# Patient Record
Sex: Male | Born: 1965 | Race: White | Hispanic: No | Marital: Married | Smoking: Current every day smoker
Health system: Southern US, Community
[De-identification: ages and names within clinical notes are randomized; demographics above are authoritative.]

## PROBLEM LIST (undated history)

## (undated) DIAGNOSIS — E785 Hyperlipidemia, unspecified: Secondary | ICD-10-CM

## (undated) DIAGNOSIS — I10 Essential (primary) hypertension: Secondary | ICD-10-CM

## (undated) DIAGNOSIS — E119 Type 2 diabetes mellitus without complications: Secondary | ICD-10-CM

## (undated) HISTORY — PX: CARDIAC SURGERY: SHX584

## (undated) HISTORY — PX: TONSILLECTOMY: SUR1361

---

## 2016-04-17 ENCOUNTER — Emergency Department (HOSPITAL_COMMUNITY): Payer: Worker's Compensation

## 2016-04-17 ENCOUNTER — Encounter (HOSPITAL_COMMUNITY): Payer: Self-pay

## 2016-04-17 ENCOUNTER — Emergency Department (HOSPITAL_COMMUNITY)
Admission: EM | Admit: 2016-04-17 | Discharge: 2016-04-17 | Disposition: A | Discharge status: Home / Self Care | Payer: Worker's Compensation | Attending: Emergency Medicine | Admitting: Emergency Medicine

## 2016-04-17 DIAGNOSIS — I10 Essential (primary) hypertension: Secondary | ICD-10-CM | POA: Insufficient documentation

## 2016-04-17 DIAGNOSIS — Z880 Allergy status to penicillin: Secondary | ICD-10-CM | POA: Insufficient documentation

## 2016-04-17 DIAGNOSIS — Z791 Long term (current) use of non-steroidal anti-inflammatories (NSAID): Secondary | ICD-10-CM | POA: Insufficient documentation

## 2016-04-17 DIAGNOSIS — E785 Hyperlipidemia, unspecified: Secondary | ICD-10-CM | POA: Insufficient documentation

## 2016-04-17 DIAGNOSIS — Y998 Other external cause status: Secondary | ICD-10-CM | POA: Diagnosis not present

## 2016-04-17 DIAGNOSIS — F17210 Nicotine dependence, cigarettes, uncomplicated: Secondary | ICD-10-CM | POA: Insufficient documentation

## 2016-04-17 DIAGNOSIS — R5383 Other fatigue: Secondary | ICD-10-CM | POA: Diagnosis not present

## 2016-04-17 DIAGNOSIS — S81811A Laceration without foreign body, right lower leg, initial encounter: Secondary | ICD-10-CM | POA: Diagnosis not present

## 2016-04-17 DIAGNOSIS — Y9389 Activity, other specified: Secondary | ICD-10-CM | POA: Insufficient documentation

## 2016-04-17 DIAGNOSIS — E119 Type 2 diabetes mellitus without complications: Secondary | ICD-10-CM | POA: Insufficient documentation

## 2016-04-17 DIAGNOSIS — S0990XA Unspecified injury of head, initial encounter: Secondary | ICD-10-CM | POA: Insufficient documentation

## 2016-04-17 DIAGNOSIS — Z23 Encounter for immunization: Secondary | ICD-10-CM | POA: Diagnosis not present

## 2016-04-17 DIAGNOSIS — Y92410 Unspecified street and highway as the place of occurrence of the external cause: Secondary | ICD-10-CM | POA: Insufficient documentation

## 2016-04-17 HISTORY — DX: Type 2 diabetes mellitus without complications: E11.9

## 2016-04-17 HISTORY — DX: Essential (primary) hypertension: I10

## 2016-04-17 HISTORY — DX: Hyperlipidemia, unspecified: E78.5

## 2016-04-17 LAB — I-STAT CHEM 8, ED
BUN: 13 mg/dL (ref 6–20)
CALCIUM ION: 1.07 mmol/L — AB (ref 1.12–1.23)
Chloride: 103 mmol/L (ref 101–111)
Creatinine, Ser: 0.9 mg/dL (ref 0.61–1.24)
Glucose, Bld: 118 mg/dL — ABNORMAL HIGH (ref 65–99)
HEMATOCRIT: 51 % (ref 39.0–52.0)
HEMOGLOBIN: 17.3 g/dL — AB (ref 13.0–17.0)
Potassium: 3.6 mmol/L (ref 3.5–5.1)
SODIUM: 139 mmol/L (ref 135–145)
TCO2: 20 mmol/L (ref 0–100)

## 2016-04-17 LAB — I-STAT TROPONIN, ED: TROPONIN I, POC: 0 ng/mL (ref 0.00–0.08)

## 2016-04-17 MED ORDER — FENTANYL CITRATE (PF) 100 MCG/2ML IJ SOLN
50.0000 ug | Freq: Once | INTRAMUSCULAR | Status: AC
  Administered 2016-04-17: 50 ug via INTRAVENOUS
  Filled 2016-04-17: qty 2

## 2016-04-17 MED ORDER — HYDROCODONE-ACETAMINOPHEN 5-325 MG PO TABS: 1.0000 | ORAL_TABLET | Freq: Four times a day (QID) | ORAL | Status: AC | PRN

## 2016-04-17 MED ORDER — NAPROXEN 500 MG PO TABS: 500.0000 mg | ORAL_TABLET | Freq: Two times a day (BID) | ORAL | Status: AC

## 2016-04-17 MED ORDER — ONDANSETRON HCL 4 MG/2ML IJ SOLN
4.0000 mg | Freq: Once | INTRAMUSCULAR | Status: AC
  Administered 2016-04-17: 4 mg via INTRAVENOUS
  Filled 2016-04-17: qty 2

## 2016-04-17 MED ORDER — IOPAMIDOL (ISOVUE-300) INJECTION 61%
INTRAVENOUS | Status: AC
  Administered 2016-04-17: 100 mL
  Filled 2016-04-17: qty 100

## 2016-04-17 MED ORDER — OXYCODONE-ACETAMINOPHEN 5-325 MG PO TABS
2.0000 | ORAL_TABLET | Freq: Once | ORAL | Status: AC
  Administered 2016-04-17: 2 via ORAL
  Filled 2016-04-17: qty 2

## 2016-04-17 MED ORDER — TETANUS-DIPHTH-ACELL PERTUSSIS 5-2.5-18.5 LF-MCG/0.5 IM SUSP
0.5000 mL | Freq: Once | INTRAMUSCULAR | Status: AC
  Administered 2016-04-17: 0.5 mL via INTRAMUSCULAR
  Filled 2016-04-17: qty 0.5
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
## 2016-04-17 NOTE — ED Notes (Signed)
Patient undressed, in gown, on monitor, continuous pulse oximetry and blood pressure cuff 
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
